# Patient Record
Sex: Female | Born: 1935 | Race: White | Hispanic: No | Marital: Single | Smoking: Never smoker
Health system: Southern US, Community
[De-identification: ages and names within clinical notes are randomized; demographics above are authoritative.]

## PROBLEM LIST (undated history)

## (undated) HISTORY — PX: KNEE SURGERY: SHX244

## (undated) HISTORY — PX: ABDOMINAL HYSTERECTOMY: SHX81

---

## 2018-03-27 ENCOUNTER — Encounter (HOSPITAL_BASED_OUTPATIENT_CLINIC_OR_DEPARTMENT_OTHER): Payer: Self-pay | Admitting: *Deleted

## 2018-03-27 ENCOUNTER — Emergency Department (HOSPITAL_BASED_OUTPATIENT_CLINIC_OR_DEPARTMENT_OTHER)
Admission: EM | Admit: 2018-03-27 | Discharge: 2018-03-27 | Disposition: A | Discharge status: Home / Self Care | Payer: Medicare Other | Attending: Emergency Medicine | Admitting: Emergency Medicine

## 2018-03-27 ENCOUNTER — Emergency Department (HOSPITAL_BASED_OUTPATIENT_CLINIC_OR_DEPARTMENT_OTHER): Payer: Medicare Other

## 2018-03-27 ENCOUNTER — Other Ambulatory Visit: Payer: Self-pay

## 2018-03-27 DIAGNOSIS — B9789 Other viral agents as the cause of diseases classified elsewhere: Secondary | ICD-10-CM

## 2018-03-27 DIAGNOSIS — J069 Acute upper respiratory infection, unspecified: Secondary | ICD-10-CM

## 2018-03-27 DIAGNOSIS — R05 Cough: Secondary | ICD-10-CM | POA: Diagnosis present

## 2018-03-27 MED ORDER — ALBUTEROL SULFATE (2.5 MG/3ML) 0.083% IN NEBU
5.0000 mg | INHALATION_SOLUTION | Freq: Once | RESPIRATORY_TRACT | Status: AC
  Administered 2018-03-27: 5 mg via RESPIRATORY_TRACT
  Filled 2018-03-27: qty 6

## 2018-03-27 MED ORDER — BENZONATATE 100 MG PO CAPS: 100.0000 mg | ORAL_CAPSULE | Freq: Three times a day (TID) | ORAL | 0 refills | Status: AC | PRN

## 2018-03-27 MED ORDER — IPRATROPIUM-ALBUTEROL 0.5-2.5 (3) MG/3ML IN SOLN
RESPIRATORY_TRACT | Status: AC
  Administered 2018-03-27: 3 mL
  Filled 2018-03-27: qty 3

## 2018-03-27 MED ORDER — GUAIFENESIN 100 MG/5ML PO SOLN
15.0000 mL | Freq: Once | ORAL | Status: AC
  Administered 2018-03-27: 300 mg via ORAL
  Filled 2018-03-27: qty 150

## 2018-03-27 MED ORDER — ALBUTEROL SULFATE HFA 108 (90 BASE) MCG/ACT IN AERS: 2.0000 | INHALATION_SPRAY | Freq: Four times a day (QID) | RESPIRATORY_TRACT | 0 refills | Status: AC | PRN

## 2018-03-27 MED ORDER — GUAIFENESIN ER 600 MG PO TB12
600.0000 mg | ORAL_TABLET | Freq: Once | ORAL | Status: DC
  Filled 2018-03-27: qty 1

## 2018-03-27 MED ORDER — BENZONATATE 100 MG PO CAPS
100.0000 mg | ORAL_CAPSULE | Freq: Once | ORAL | Status: AC
  Administered 2018-03-27: 100 mg via ORAL
  Filled 2018-03-27: qty 1

## 2018-03-27 NOTE — ED Notes (Signed)
ED Provider at bedside. 

## 2018-03-27 NOTE — ED Triage Notes (Addendum)
Dx with bronchitis on Dec 24. Reports no improvement-cough noted in triage.  Denies fever

## 2018-03-27 NOTE — ED Notes (Signed)
Pt and family member understood dc material. NAD Noted. Scripts given at [facility]. Inhaler technique reviewed

## 2018-03-27 NOTE — Discharge Instructions (Addendum)
You can take guaifenesin (mucinex) over the counter for congestion according to label instructions.

## 2018-03-27 NOTE — Progress Notes (Signed)
Patient states that she is breathing better after having had a second nebulizer treatment.

## 2018-03-27 NOTE — ED Provider Notes (Signed)
[facility] EMERGENCY DEPARTMENT Provider Note   CSN: [id number] Arrival date & time: 03/27/18  1720     History   Chief Complaint Chief Complaint  Patient presents with  . URI    HPI Miranda Douglas is a 84 y.o. female.  The history is provided by the patient and a relative. No language interpreter was used.  URI     Miranda Douglas is a 84 y.o. female who presents to the Emergency Department complaining of cough. Presents to the emergency department for evaluation of cough and URI symptoms. She fell ill about one week ago with fever, chills, cough. She was evaluated in urgent care on December 24 and had chest x-ray performed at that time he was diagnosed with bronchitis. She is been taking over-the-counter medications and presents today for ongoing chest tightness, wheezing, cough, nasal congestion/drainage.  She initially has sore throat - now resolved.  Her fevers have resolved. She denies any vomiting, abdominal pain, leg swelling or pain. She has no medical problems and takes no medications. No history of lung disease reactive airway disease. He does have a history of allergy to corticosteroids with difficulty breathing. History reviewed. No pertinent past medical history.  There are no active problems to display for this patient.   Past Surgical History:  Procedure Laterality Date  . ABDOMINAL HYSTERECTOMY    . KNEE SURGERY       OB History   No obstetric history on file.      Home Medications    Prior to Admission medications   Medication Sig Start Date End Date Taking? Authorizing Provider  albuterol (PROVENTIL HFA;VENTOLIN HFA) 108 (90 Base) MCG/ACT inhaler Inhale 2 puffs into the lungs every 6 (six) hours as needed for wheezing or shortness of breath. 03/27/18   Tilden Fossaees, Damico Partin, MD  benzonatate (TESSALON) 100 MG capsule Take 1 capsule (100 mg total) by mouth 3 (three) times daily as needed for cough. 03/27/18   Tilden Fossaees, Nylene Inlow, MD    Family  History History reviewed. No pertinent family history.  Social History Social History   Tobacco Use  . Smoking status: Never Smoker  Substance Use Topics  . Alcohol use: Not Currently  . Drug use: Not Currently     Allergies   Corticosteroids   Review of Systems Review of Systems  All other systems reviewed and are negative.    Physical Exam Updated Vital Signs BP (!) 152/64   Pulse 74   Temp 98.1 F (36.7 C) (Oral)   Resp 18   Ht 5\' 2"  (1.575 m)   Wt 64.4 kg   SpO2 98%   BMI 25.97 kg/m   Physical Exam Vitals signs and nursing note reviewed.  Constitutional:      Appearance: She is well-developed.  HENT:     Head: Normocephalic and atraumatic.     Nose: Rhinorrhea present.     Mouth/Throat:     Mouth: Mucous membranes are moist.     Pharynx: No oropharyngeal exudate or posterior oropharyngeal erythema.  Cardiovascular:     Rate and Rhythm: Normal rate and regular rhythm.     Heart sounds: No murmur.  Pulmonary:     Effort: Pulmonary effort is normal. No respiratory distress.     Comments: Diffuse wheezes Abdominal:     Palpations: Abdomen is soft.     Tenderness: There is no abdominal tenderness. There is no guarding or rebound.  Musculoskeletal:        General: No tenderness.  Skin:  General: Skin is warm and dry.  Neurological:     Mental Status: She is alert and oriented to person, place, and time.  Psychiatric:        Behavior: Behavior normal.      ED Treatments / Results  Labs (all labs ordered are listed, but only abnormal results are displayed) Labs Reviewed - No data to display  EKG None  Radiology Dg Chest 2 View  Result Date: 03/27/2018 CLINICAL DATA:  Bronchitis and cough. EXAM: CHEST - 2 VIEW COMPARISON:  None. FINDINGS: Normal heart size. Moderate aortic atherosclerosis at the arch without aneurysm. No acute pulmonary consolidation. Vascular summation shadow accounting for a nodular density at the right lung base is  suspected. No overt pulmonary edema, effusion or pneumothorax. Mild degenerative change along the midthoracic spine. IMPRESSION: No active cardiopulmonary disease. Electronically Signed   By: Tollie Ethavid  Kwon M.D.   On: 03/27/2018 18:27    Procedures Procedures (including critical care time)  Medications Ordered in ED Medications  ipratropium-albuterol (DUONEB) 0.5-2.5 (3) MG/3ML nebulizer solution (3 mLs  Given 03/27/18 1734)  albuterol (PROVENTIL) (2.5 MG/3ML) 0.083% nebulizer solution 5 mg (5 mg Nebulization Given 03/27/18 1834)  benzonatate (TESSALON) capsule 100 mg (100 mg Oral Given 03/27/18 1845)  guaiFENesin (ROBITUSSIN) 100 MG/5ML solution 300 mg (300 mg Oral Given 03/27/18 1844)     Initial Impression / Assessment and Plan / ED Course  I have reviewed the triage vital signs and the nursing notes.  Pertinent labs & imaging results that were available during my care of the patient were reviewed by me and considered in my medical decision making (see chart for details).     Patient here for evaluation of cough and congestion for the last five days. She is non-toxic appearing on evaluation with no respiratory distress. Current presentation is not consistent with pneumonia, PE, CHF. Discussed with patient home care for viral URI. Discussed outpatient follow-up and return precautions.  Final Clinical Impressions(s) / ED Diagnoses   Final diagnoses:  Viral URI with cough    ED Discharge Orders         Ordered    benzonatate (TESSALON) 100 MG capsule  3 times daily PRN     03/27/18 1924    albuterol (PROVENTIL HFA;VENTOLIN HFA) 108 (90 Base) MCG/ACT inhaler  Every 6 hours PRN     03/27/18 1924           Tilden Fossaees, Vitaly Wanat, MD 03/28/18 [phone]

## 2019-04-20 ENCOUNTER — Ambulatory Visit: Payer: Medicare Other | Attending: Internal Medicine

## 2019-04-20 DIAGNOSIS — Z23 Encounter for immunization: Secondary | ICD-10-CM | POA: Insufficient documentation

## 2019-04-20 NOTE — Progress Notes (Signed)
   Covid-19 Vaccination Clinic  Name:  Miranda Douglas    MRN: [id number] DOB: 1931-05-28  04/20/2019  [name] was observed post Covid-19 immunization for 15 minutes without incidence. She was provided with Vaccine Information Sheet and instruction to access the V-Safe system.   [name] was instructed to call 911 with any severe reactions post vaccine: Marland Kitchen Difficulty breathing  . Swelling of your face and throat  . A fast heartbeat  . A bad rash all over your body  . Dizziness and weakness    Immunizations Administered    Name Date Dose VIS Date Route   Pfizer COVID-19 Vaccine 04/20/2019 10:58 AM 0.3 mL 03/11/2019 Intramuscular   Manufacturer: [employer], [employer]   Lot: [id number]   NDC: [address]-7

## 2019-05-11 ENCOUNTER — Ambulatory Visit: Payer: Medicare Other | Attending: Internal Medicine

## 2019-05-11 DIAGNOSIS — Z23 Encounter for immunization: Secondary | ICD-10-CM | POA: Insufficient documentation

## 2019-05-11 NOTE — Progress Notes (Signed)
   Covid-19 Vaccination Clinic  Name:  Miranda Douglas    MRN: [id number] DOB: March 12, 1932  05/11/2019  Miranda Douglas was observed post Covid-19 immunization for 15 minutes without incidence. She was provided with Vaccine Information Sheet and instruction to access the V-Safe system.   Miranda Douglas was instructed to call 911 with any severe reactions post vaccine: Marland Kitchen Difficulty breathing  . Swelling of your face and throat  . A fast heartbeat  . A bad rash all over your body  . Dizziness and weakness    Immunizations Administered    Name Date Dose VIS Date Route   Pfizer COVID-19 Vaccine 05/11/2019  3:28 PM 0.3 mL 03/11/2019 Intramuscular   Manufacturer: [employer], [employer]   Lot: [id number]   NDC: [address]-6

## 2020-07-21 IMAGING — CR DG CHEST 2V
2 series · 2 of 2 positions shown · non-contrast
Comparison: None.

CLINICAL DATA: Bronchitis and cough.

EXAM:
CHEST - 2 VIEW

[w chest pa]
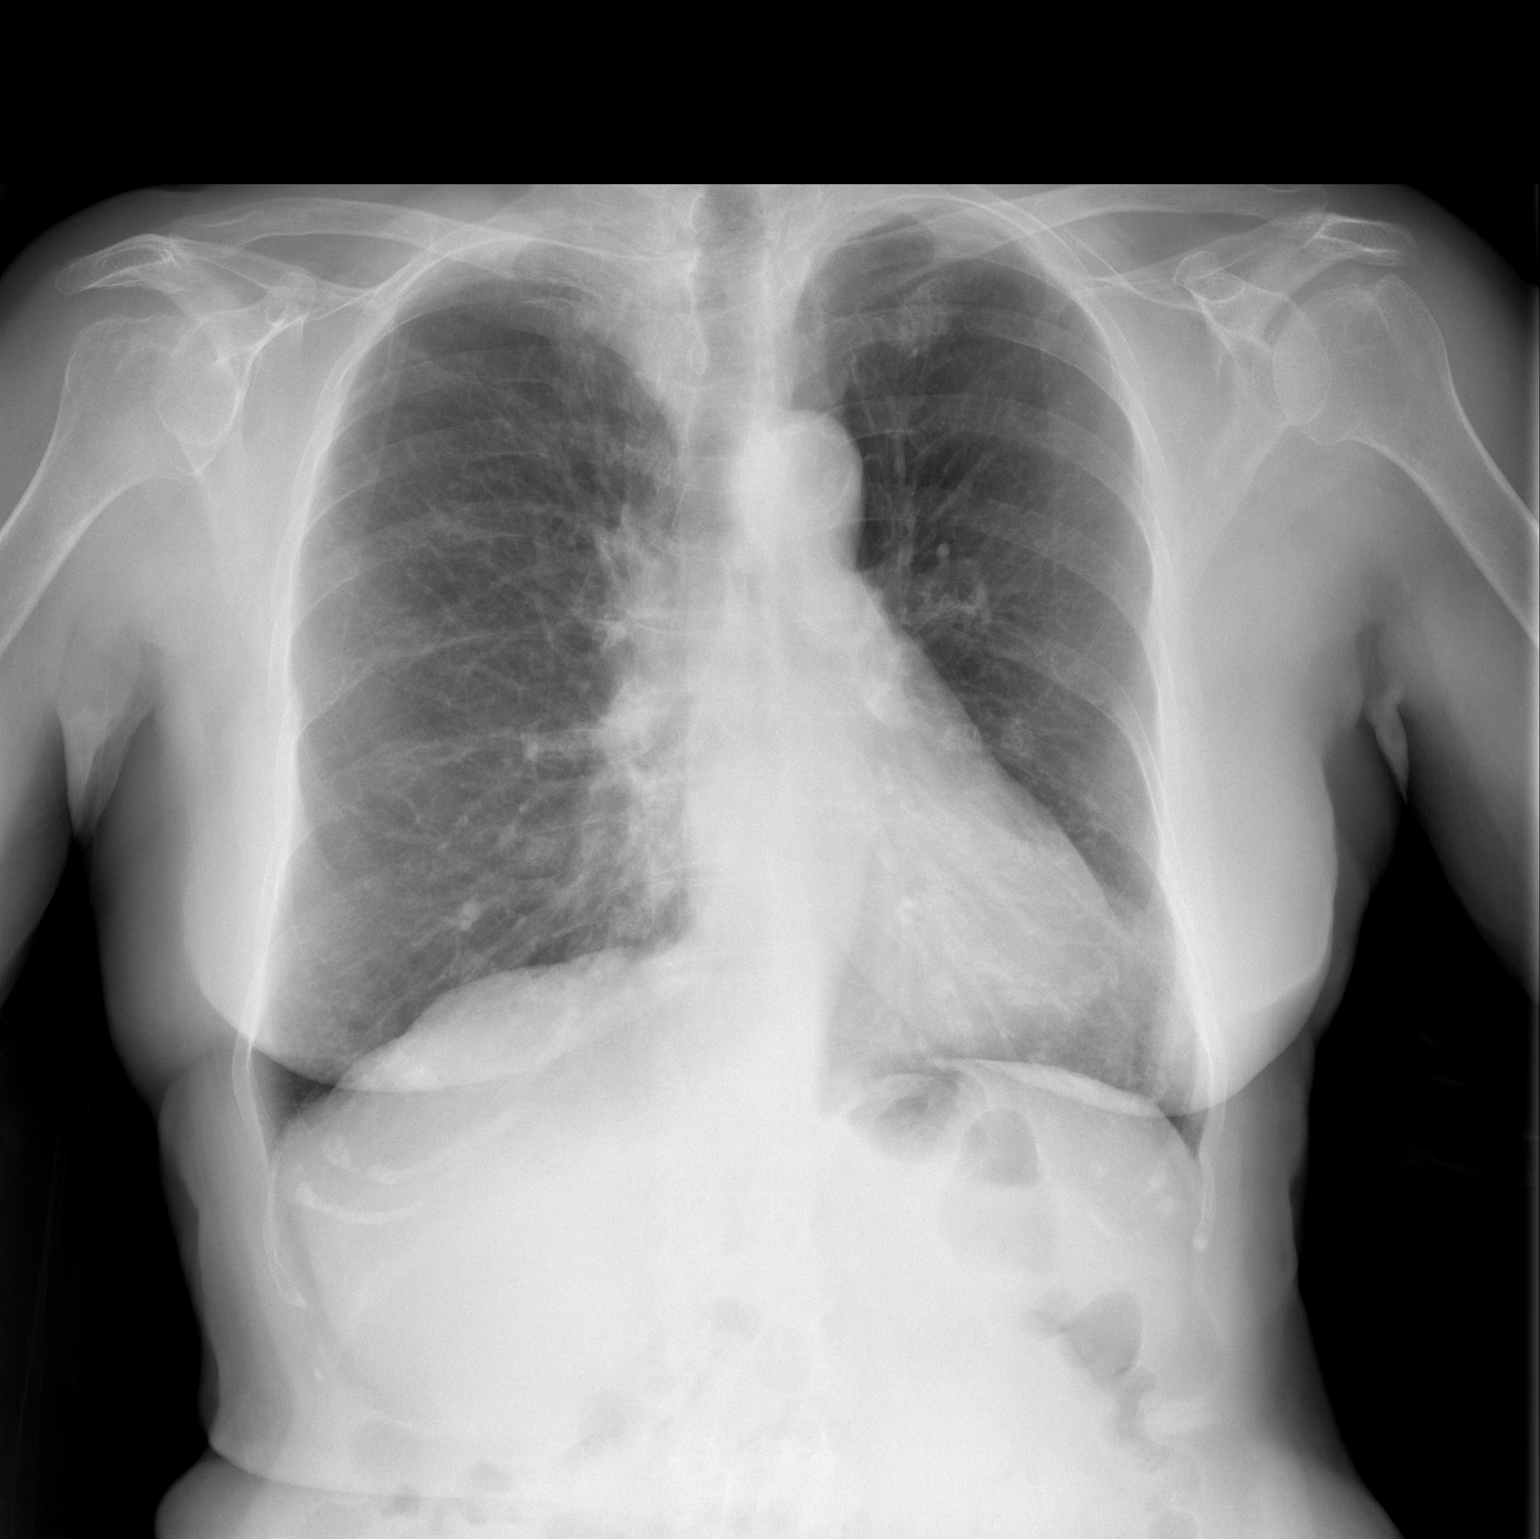

[w chest lat]
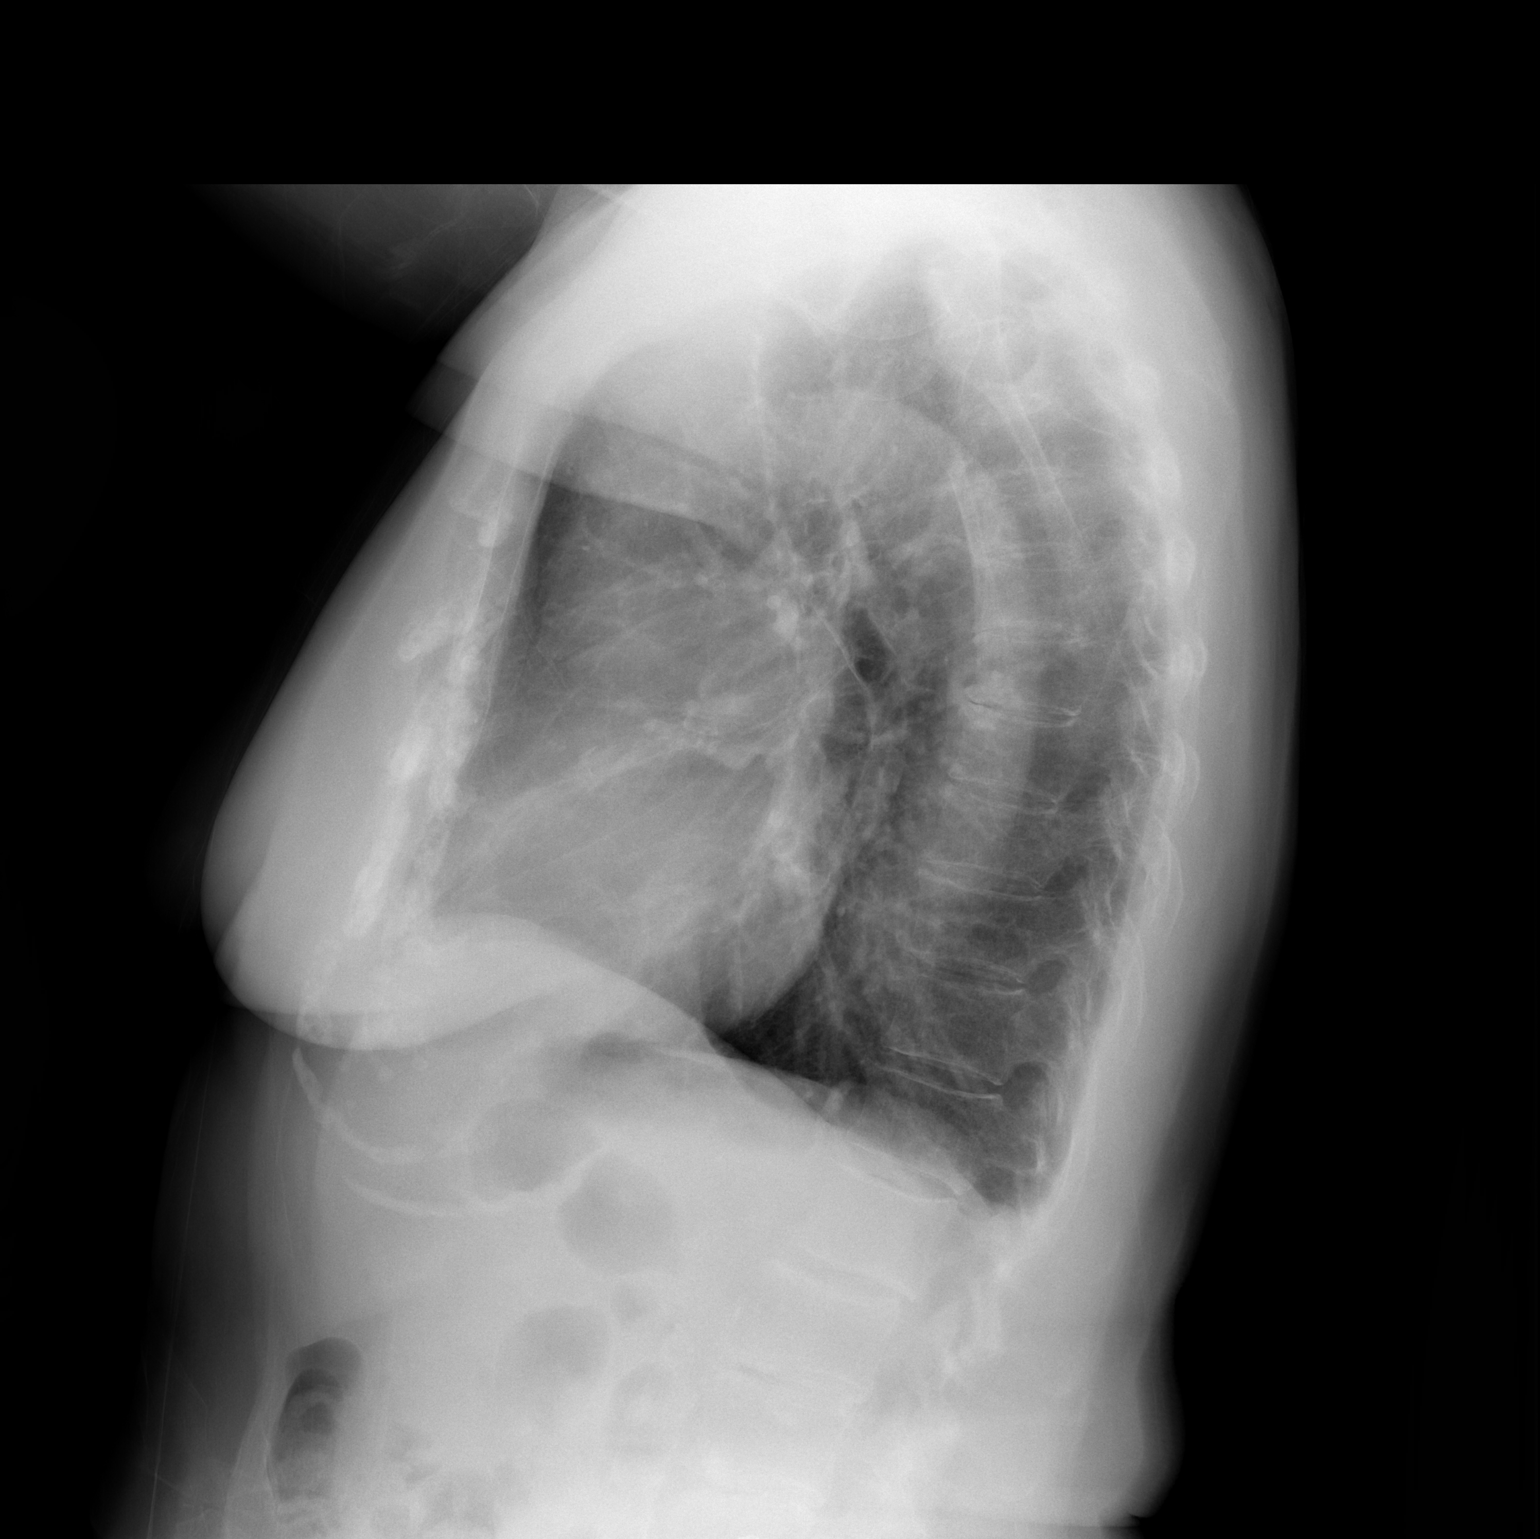

[2 of 2 positions shown; findings below may reference images not displayed]

FINDINGS: Normal heart size. Moderate aortic atherosclerosis at the arch
without aneurysm. No acute pulmonary consolidation. Vascular
summation shadow accounting for a nodular density at the right lung
base is suspected. No overt pulmonary edema, effusion or
pneumothorax. Mild degenerative change along the midthoracic spine.
IMPRESSION: No active cardiopulmonary disease.
